# Patient Record
Sex: Female | Born: 1990
Health system: Southern US, Community
[De-identification: ages and names within clinical notes are randomized; demographics above are authoritative.]

## PROBLEM LIST (undated history)

## (undated) DIAGNOSIS — K259 Gastric ulcer, unspecified as acute or chronic, without hemorrhage or perforation: Secondary | ICD-10-CM

## (undated) DIAGNOSIS — G43909 Migraine, unspecified, not intractable, without status migrainosus: Secondary | ICD-10-CM

## (undated) DIAGNOSIS — F32A Depression, unspecified: Secondary | ICD-10-CM

## (undated) DIAGNOSIS — K219 Gastro-esophageal reflux disease without esophagitis: Secondary | ICD-10-CM

## (undated) DIAGNOSIS — E039 Hypothyroidism, unspecified: Secondary | ICD-10-CM

## (undated) DIAGNOSIS — K449 Diaphragmatic hernia without obstruction or gangrene: Secondary | ICD-10-CM

## (undated) DIAGNOSIS — F419 Anxiety disorder, unspecified: Secondary | ICD-10-CM

## (undated) DIAGNOSIS — U071 COVID-19: Secondary | ICD-10-CM

## (undated) DIAGNOSIS — F329 Major depressive disorder, single episode, unspecified: Secondary | ICD-10-CM

## (undated) HISTORY — DX: Major depressive disorder, single episode, unspecified: F32.9

## (undated) HISTORY — DX: Migraine, unspecified, not intractable, without status migrainosus: G43.909

## (undated) HISTORY — DX: Depression, unspecified: F32.A

## (undated) HISTORY — DX: Diaphragmatic hernia without obstruction or gangrene: K44.9

## (undated) HISTORY — DX: Hypothyroidism, unspecified: E03.9

## (undated) HISTORY — PX: OTHER SURGICAL HISTORY: SHX169

## (undated) HISTORY — DX: Gastric ulcer, unspecified as acute or chronic, without hemorrhage or perforation: K25.9

## (undated) HISTORY — DX: Anxiety disorder, unspecified: F41.9

---

## 2013-10-18 ENCOUNTER — Other Ambulatory Visit: Payer: Self-pay | Admitting: *Deleted

## 2013-10-18 DIAGNOSIS — R109 Unspecified abdominal pain: Secondary | ICD-10-CM

## 2013-10-19 ENCOUNTER — Other Ambulatory Visit: Payer: Self-pay | Admitting: *Deleted

## 2013-10-19 ENCOUNTER — Other Ambulatory Visit: Payer: Self-pay | Admitting: Pediatrics

## 2013-10-19 DIAGNOSIS — R109 Unspecified abdominal pain: Secondary | ICD-10-CM

## 2013-10-21 ENCOUNTER — Ambulatory Visit
Admission: RE | Admit: 2013-10-21 | Discharge: 2013-10-21 | Disposition: A | Discharge status: Home / Self Care | Payer: BC Managed Care – PPO | Source: Ambulatory Visit | Attending: *Deleted | Admitting: *Deleted

## 2013-10-21 DIAGNOSIS — R109 Unspecified abdominal pain: Secondary | ICD-10-CM

## 2015-05-02 HISTORY — PX: VAGINAL SEPTUM RESECTION: SHX2644

## 2018-07-14 ENCOUNTER — Ambulatory Visit: Payer: BC Managed Care – PPO | Admitting: Diagnostic Neuroimaging

## 2018-07-14 ENCOUNTER — Encounter: Payer: Self-pay | Admitting: *Deleted

## 2018-07-14 ENCOUNTER — Encounter: Payer: Self-pay | Admitting: Diagnostic Neuroimaging

## 2018-07-14 VITALS — BP 118/76 | HR 102 | Ht 63.0 in | Wt 178.0 lb

## 2018-07-14 DIAGNOSIS — R51 Headache: Secondary | ICD-10-CM

## 2018-07-14 DIAGNOSIS — R519 Headache, unspecified: Secondary | ICD-10-CM

## 2018-07-14 DIAGNOSIS — G43009 Migraine without aura, not intractable, without status migrainosus: Secondary | ICD-10-CM | POA: Diagnosis not present

## 2018-07-14 DIAGNOSIS — G8929 Other chronic pain: Secondary | ICD-10-CM

## 2018-07-14 MED ORDER — RIZATRIPTAN BENZOATE 10 MG PO TBDP: 10.0000 mg | ORAL_TABLET | ORAL | 11 refills | Status: DC | PRN

## 2018-07-14 MED ORDER — PROPRANOLOL HCL 20 MG PO TABS: 20.0000 mg | ORAL_TABLET | Freq: Two times a day (BID) | ORAL | 6 refills | Status: DC

## 2018-07-14 NOTE — Progress Notes (Signed)
[facility]  PATIENT: Carol Curry DOB: August 12, 1990  REFERRING CLINICIAN: Dr Maryjean [address]ah[address] Street HISTORY FROM: patient  REASON FOR VISIT: concerns of frequent headaches    HISTORICAL  CHIEF COMPLAINT:  Chief Complaint  Patient presents with  . New Patient (Initial Visit)    Referred by Dr. Maryjean Kahristopher Street  . Headaches/ Migraines    Rm 6, alone.  Migraines since 28y/o.      HISTORY OF PRESENT ILLNESS:    28 year old female with with concerns of migraine headaches. She has a history of gastric ulcers. hypothyroid, depression, and anxiety.   She reports a history of migraines since middle school (age [age] years old). She feels that this has worsened with age. She has 12-16 headaches a month with 1-2 being severe. Headache starts as a dull constant headache in bilateral temporal regions that radiates to the back of her head. If left untreated, she reports more of a constant pressure that will localize around either neck or jaw. Most often it will stay bilateral but sometimes will localize to one side or the other. When headaches are severe she will have light and sound sensitivity with nausea.   Her father and three siblings also suffer from migraines. She is taking Tylenol 1000 mg 3-4 times a week.   Usually the tylenol does help but she reports that she has to take a second dose of Tylenol.  She has used Excedrin in the past that did not help. She has never tried preventative therapy. She can not tolerate NSAIDs due to overuse of NSAIDs in college which led to stomach ulcer.   She denies aura symptoms. She has never had any brain imaging.   She stays tired all the time. She is being treated with levothyroxine 25mcg. She denies concerns of sleep apnea. She is also taking Cymbalta daily for anxiety/depression and feels that it is working well.     REVIEW OF SYSTEMS: Full 14 system review of systems performed and negative with exception of: seasonal  allergies, runny nose, headaches, sleepiness, depression, anxiety, too much sleep, decreased energy.   ALLERGIES: No Known Allergies  HOME MEDICATIONS: Outpatient Medications Prior to Visit  Medication Sig Dispense Refill  . acetaminophen (TYLENOL) 500 MG tablet Take 1,000 mg by mouth every 4 (four) hours as needed.    . DULoxetine (CYMBALTA) 60 MG capsule Take 60 mg by mouth daily.    . norethindrone-ethinyl estradiol (MICROGESTIN,JUNEL,LOESTRIN) 1-20 MG-MCG tablet Take 1 tablet by mouth daily.    Marland Kitchen. levothyroxine (SYNTHROID, LEVOTHROID) 25 MCG tablet Take 25 mcg by mouth daily before breakfast.    . busPIRone (BUSPAR) 5 MG tablet Take 5 mg by mouth 3 (three) times daily.    Marland Kitchen. escitalopram (LEXAPRO) 20 MG tablet Take 20 mg by mouth daily.     No facility-administered medications prior to visit.     PAST MEDICAL HISTORY: Past Medical History:  Diagnosis Date  . Anxiety   . Depression   . Hiatal hernia   . Hypothyroid   . Migraines   . Stomach ulcer     PAST SURGICAL HISTORY: Past Surgical History:  Procedure Laterality Date  . VAGINAL SEPTUM RESECTION  05/2015  . wisdom tooth extraction      FAMILY HISTORY: Family History  Problem Relation Age of Onset  . Hypertension Father   . Hyperlipidemia Father   . Diabetes Father   . Migraines Father   . Diabetes Paternal Grandmother   . Colon cancer Paternal Grandmother   .  Anxiety disorder Mother   . Migraines Sister   . Migraines Brother   . Migraines Sister     SOCIAL HISTORY: Social History   Socioeconomic History  . Marital status: Single    Spouse name: Not on file  . Number of children: Not on file  . Years of education: Not on file  . Highest education level: Not on file  Occupational History  . Not on file  Social Needs  . Financial resource strain: Not on file  . Food insecurity:    Worry: Not on file    Inability: Not on file  . Transportation needs:    Medical: Not on file    Non-medical: Not on  file  Tobacco Use  . Smoking status: Never Smoker  . Smokeless tobacco: Never Used  Substance and Sexual Activity  . Alcohol use: Yes    Comment: 2 drinks per month  . Drug use: Never  . Sexual activity: Not on file  Lifestyle  . Physical activity:    Days per week: Not on file    Minutes per session: Not on file  . Stress: Not on file  Relationships  . Social connections:    Talks on phone: Not on file    Gets together: Not on file    Attends religious service: Not on file    Active member of club or organization: Not on file    Attends meetings of clubs or organizations: Not on file    Relationship status: Not on file  . Intimate partner violence:    Fear of current or ex partner: Not on file    Emotionally abused: Not on file    Physically abused: Not on file    Forced sexual activity: Not on file  Other Topics Concern  . Not on file  Social History Narrative   Lives with spouse.  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  Education: Chief Operating Officer.  Children None.  Caffeine 1 cup coffee daily.      PHYSICAL EXAM  GENERAL EXAM/CONSTITUTIONAL: Vitals:  Vitals:   07/14/18 1535  BP: 118/76  Pulse: (!) 102  Weight: 178 lb (80.7 kg)  Height: 5\' 3"  (1.6 m)   Body mass index is 31.53 kg/m. Wt Readings from Last 3 Encounters:  07/14/18 178 lb (80.7 kg)    Patient is in no distress; well developed, nourished and groomed; neck is supple  CARDIOVASCULAR:  Examination of carotid arteries is normal; no carotid bruits  Regular rate and rhythm, no murmurs  Examination of peripheral vascular system by observation and palpation is normal  EYES:  Ophthalmoscopic exam of optic discs and posterior segments is normal; no papilledema or hemorrhages  Visual Acuity Screening   Right eye Left eye Both eyes  Without correction: 20/20 20/30   With correction:       MUSCULOSKELETAL:  Gait, strength, tone, movements noted in Neurologic exam  below  NEUROLOGIC: MENTAL STATUS:  No flowsheet data found.  awake, alert, oriented to person, place and time  recent and remote memory intact  normal attention and concentration  language fluent, comprehension intact, naming intact  fund of knowledge appropriate  CRANIAL NERVE:   2nd - no papilledema on fundoscopic exam  2nd, 3rd, 4th, 6th - pupils equal and reactive to light, visual fields full to confrontation, extraocular muscles intact, no nystagmus  5th - facial sensation symmetric  7th - facial strength symmetric  8th - hearing intact  9th - palate elevates  symmetrically, uvula midline  11th - shoulder shrug symmetric  12th - tongue protrusion midline  MOTOR:   normal bulk and tone, full strength in the BUE, BLE  SENSORY:   normal and symmetric to light touch, temperature, vibration  COORDINATION:   finger-nose-finger  REFLEXES:   deep tendon reflexes present and symmetric  GAIT/STATION:   narrow based gait     DIAGNOSTIC DATA (LABS, IMAGING, TESTING) - I reviewed patient records, labs, notes, testing and imaging myself where available.  No results found for: WBC, HGB, HCT, MCV, PLT No results found for: NA, K, CL, CO2, GLUCOSE, BUN, CREATININE, CALCIUM, PROT, ALBUMIN, AST, ALT, ALKPHOS, BILITOT, GFRNONAA, GFRAA No results found for: CHOL, HDL, LDLCALC, LDLDIRECT, TRIG, CHOLHDL No results found for: ATFT7D No results found for: VITAMINB12 No results found for: TSH       ASSESSMENT AND PLAN  28 y.o. year old female here with history of migraine without aura since 28 years old, with progressively worsening headaches over time.    Dx:  1. Chronic nonintractable headache, unspecified headache type   2. Migraine without aura and without status migrainosus, not intractable     PLAN:  - check MRI brain w/wo (rule out secondary causes of HA; worsening HA) - propranolol 20mg  twice a day --> migraine prevention - rizatriptan 10mg  as  needed for breakthrough headache; may repeat x 1 after 2 hours; max 2 tabs per day or 8 per month  Orders Placed This Encounter  Procedures  . MR BRAIN W WO CONTRAST   Meds ordered this encounter  Medications  . rizatriptan (MAXALT-MLT) 10 MG disintegrating tablet    Sig: Take 1 tablet (10 mg total) by mouth as needed for migraine. May repeat in 2 hours if needed    Dispense:  9 tablet    Refill:  11  . propranolol (INDERAL) 20 MG tablet    Sig: Take 1 tablet (20 mg total) by mouth 2 (two) times daily.    Dispense:  60 tablet    Refill:  6   Return in about 3 months (around 10/13/2018) for with NP/PA (Amy).    Suanne Marker, MD 07/14/2018, 4:11 PM Certified in Neurology, Neurophysiology and Neuroimaging  [facility] [address] [place], [place] [address] [phone]

## 2018-07-14 NOTE — Patient Instructions (Addendum)
-   check MRI brain   - start propranolol 20mg  twice a day (migraine prevention)  - rizatriptan 10mg  as needed for breakthrough headache; may repeat x 1 after 2 hours; max 2 tabs per day or 8 per month   To prevent or relieve headaches, try the following:  Cool Compress. Lie down and place a cool compress on your head.   Avoid headache triggers. If certain foods or odors seem to have triggered your migraines in the past, avoid them. A headache diary might help you identify triggers.   Include physical activity in your daily routine.   Manage stress. Find healthy ways to cope with the stressors, such as delegating tasks on your to-do list.   Practice relaxation techniques. Try deep breathing, yoga, massage and visualization.   Eat regularly. Eating regularly scheduled meals and maintaining a healthy diet might help prevent headaches. Also, drink plenty of fluids.   Follow a regular sleep schedule. Sleep deprivation might contribute to headaches  Consider biofeedback. With this mind-body technique, you learn to control certain bodily functions - such as muscle tension, heart rate and blood pressure - to prevent headaches or reduce headache pain.

## 2018-07-15 ENCOUNTER — Telehealth: Payer: Self-pay | Admitting: Diagnostic Neuroimaging

## 2018-07-15 NOTE — Telephone Encounter (Signed)
lvm for pt to call back about scheduling mri  [employer] Auth: [id number] (exp. 07/15/18 to 08/13/18)

## 2018-10-07 ENCOUNTER — Telehealth: Payer: Self-pay | Admitting: Family Medicine

## 2018-10-07 NOTE — Telephone Encounter (Signed)
I called and spoke with the patient regarding changing their apt to a VV. I explained to the patient that we would file their insurance and they gave consent. I also walked through the steps of downloading the app, and starting the virtual meeting, after confirming the patient had access to a smart phone with a working camera and microphone access.   Email: [email or web address]

## 2018-10-07 NOTE — Telephone Encounter (Signed)
LMVM for pt to return call (update chart) medications, allergies, history and pharmacy.

## 2018-10-07 NOTE — Telephone Encounter (Signed)
Updated meds, allergies, hx, pharmacy. Needs refill on maxalt.

## 2018-10-07 NOTE — Addendum Note (Signed)
Addended by: Guy Begin on: 10/07/2018 04:56 PM   Modules accepted: Orders

## 2018-10-08 ENCOUNTER — Ambulatory Visit (INDEPENDENT_AMBULATORY_CARE_PROVIDER_SITE_OTHER): Payer: BC Managed Care – PPO | Admitting: Family Medicine

## 2018-10-08 ENCOUNTER — Encounter: Payer: Self-pay | Admitting: Family Medicine

## 2018-10-08 ENCOUNTER — Other Ambulatory Visit: Payer: Self-pay

## 2018-10-08 DIAGNOSIS — G43009 Migraine without aura, not intractable, without status migrainosus: Secondary | ICD-10-CM | POA: Diagnosis not present

## 2018-10-08 MED ORDER — PROPRANOLOL HCL 20 MG PO TABS: 20.0000 mg | ORAL_TABLET | Freq: Two times a day (BID) | ORAL | 6 refills | Status: DC

## 2018-10-08 MED ORDER — TOPIRAMATE 25 MG PO TABS: 25.0000 mg | ORAL_TABLET | Freq: Every day | ORAL | 3 refills | Status: DC

## 2018-10-08 MED ORDER — RIZATRIPTAN BENZOATE 10 MG PO TBDP: 10.0000 mg | ORAL_TABLET | ORAL | 11 refills | Status: DC | PRN

## 2018-10-08 NOTE — Progress Notes (Signed)
I reviewed note and agree with plan.   Suanne Marker, MD 10/08/2018, 11:11 AM Certified in Neurology, Neurophysiology and Neuroimaging  [facility] [address] [place], [place] [address] [phone]

## 2018-10-08 NOTE — Progress Notes (Signed)
PATIENT: Carol Curry DOB: March 12, 1991  REASON FOR VISIT: follow up HISTORY FROM: patient  Virtual Visit via Telephone Note  I connected with Carol Curry on 10/08/18 at  8:30 AM EDT by telephone and verified that I am speaking with the correct person using two identifiers.   I discussed the limitations, risks, security and privacy concerns of performing an evaluation and management service by telephone and the availability of in person appointments. I also discussed with the patient that there may be a patient responsible charge related to this service. The patient expressed understanding and agreed to proceed.   History of Present Illness:  10/08/18 Carol Curry is a 28 y.o. female for follow up. She was started on propranolol 20mg  BID and rizatriptan as needed at her last visit.  She has noticed a decrease in the severity of her headaches.  Unfortunately she continues to have daily headaches.  She is trying to limit doses of Tylenol but continues at least 3 doses per week.  She has not had MRI.  She does not feel that this is necessary at this time.  She has tolerated medications well with no obvious adverse effects.  No history of kidney stones.    HPI (copied from Dr Richrd Humbles note on 07/14/2018):  28 year old female with with concerns of migraine headaches. She has a history of gastric ulcers. hypothyroid, depression, and anxiety.   She reports a history of migraines since middle school (age [age] years old). She feels that this has worsened with age. She has 12-16 headaches a month with 1-2 being severe. Headache starts as a dull constant headache in bilateral temporal regions that radiates to the back of her head. If left untreated, she reports more of a constant pressure that will localize around either neck or jaw. Most often it will stay bilateral but sometimes will localize to one side or the other. When headaches are severe she will have light and sound sensitivity  with nausea.   Her father and three siblings also suffer from migraines. She is taking Tylenol 1000 mg 3-4 times a week.   Usually the tylenol does help but she reports that she has to take a second dose of Tylenol.  She has used Excedrin in the past that did not help. She has never tried preventative therapy. She can not tolerate NSAIDs due to overuse of NSAIDs in college which led to stomach ulcer.   She denies aura symptoms. She has never had any brain imaging.   She stays tired all the time. She is being treated with levothyroxine . She denies concerns of sleep apnea. She is also taking Cymbalta daily for anxiety/depression and feels that it is working well.    Observations/Objective:  Generalized: Well developed, in no acute distress  Mentation: Alert oriented to time, place, history taking. Follows all commands speech and language fluent Motor: moves all extremities fluently.    Assessment and Plan:  28 y.o. year old female  has a past medical history of Anxiety, Depression, Hiatal hernia, Hypothyroid, Migraines, and Stomach ulcer. here with    ICD-10-CM   1. Migraine without aura and without status migrainosus, not intractable G43.009    Ms. Carol Curry is doing well on propranolol and rizatriptan, however, she continues to have frequent headaches with migrainous features.  We have discussed several options including increasing propranolol or adding another agent.  She wishes to add topiramate 25 mg at night.  We will continue propranolol 20 mg twice daily.  We will also refill her rizatriptan.  She was reeducated on appropriate ministration of these medications.  I have also advised against regular use of Tylenol.  She was instructed to call the office in 2 to 3 weeks with a progress report.  May increase topiramate at that time if well tolerated.  I have also educated Mrs. Carol Curry on the potential for topiramate to lower the effectiveness of birth control, although we are  starting at a low dose.  She was advised against pregnancy.  She verbalizes understanding and agreement with this plan.  No orders of the defined types were placed in this encounter.   No orders of the defined types were placed in this encounter.    Follow Up Instructions:  I discussed the assessment and treatment plan with the patient. The patient was provided an opportunity to ask questions and all were answered. The patient agreed with the plan and demonstrated an understanding of the instructions.   The patient was advised to call back or seek an in-person evaluation if the symptoms worsen or if the condition fails to improve as anticipated.  I provided 25 minutes of non-face-to-face time during this encounter.  Patient is located at her place of residence during video conference.  Provider is located at her place of residence.  Alverda SkeansSandy Young, RN help to facilitate visit.   Shawnie DapperAmy Rorey Bisson, NP

## 2018-10-13 ENCOUNTER — Ambulatory Visit: Payer: BC Managed Care – PPO | Admitting: Family Medicine

## 2018-11-11 ENCOUNTER — Telehealth: Payer: Self-pay | Admitting: Family Medicine

## 2018-11-11 NOTE — Telephone Encounter (Signed)
Called and lm on vm for pt to call [facility] to schedule Return in about 6 months (around 04/09/2019). Per Shawnie Dapper, NP

## 2019-10-14 ENCOUNTER — Other Ambulatory Visit: Payer: Self-pay | Admitting: Family Medicine

## 2019-10-14 DIAGNOSIS — G43009 Migraine without aura, not intractable, without status migrainosus: Secondary | ICD-10-CM

## 2019-10-14 MED ORDER — TOPIRAMATE 25 MG PO TABS: 25.0000 mg | ORAL_TABLET | Freq: Every day | ORAL | 3 refills | Status: DC

## 2019-10-14 MED ORDER — PROPRANOLOL HCL 20 MG PO TABS: 20.0000 mg | ORAL_TABLET | Freq: Two times a day (BID) | ORAL | 6 refills | Status: DC

## 2019-10-14 NOTE — Telephone Encounter (Signed)
1) Medication(s) Requested (by name): topiramate (TOPAMAX) 25 MG tablet  propranolol (INDERAL) 20 MG tablet   2) Pharmacy of Choice: [facility] - [place], [place] - [address]  [address] [place], [place] [address]

## 2019-12-13 ENCOUNTER — Telehealth: Payer: Self-pay | Admitting: Adult Health

## 2019-12-13 NOTE — Telephone Encounter (Signed)
Patient LVM. Calling to confirm appointment. No appointment scheduled. Nor has she been seen at our office. Please call.

## 2019-12-22 ENCOUNTER — Other Ambulatory Visit: Payer: Self-pay

## 2019-12-22 ENCOUNTER — Encounter: Payer: Self-pay | Admitting: Family Medicine

## 2019-12-22 ENCOUNTER — Ambulatory Visit: Payer: BC Managed Care – PPO | Admitting: Family Medicine

## 2019-12-22 DIAGNOSIS — G43009 Migraine without aura, not intractable, without status migrainosus: Secondary | ICD-10-CM

## 2019-12-22 MED ORDER — PROPRANOLOL HCL 20 MG PO TABS: 20.0000 mg | ORAL_TABLET | Freq: Two times a day (BID) | ORAL | 3 refills | Status: DC

## 2019-12-22 MED ORDER — TOPIRAMATE 25 MG PO TABS: 25.0000 mg | ORAL_TABLET | Freq: Every day | ORAL | 3 refills | Status: DC

## 2019-12-22 MED ORDER — RIZATRIPTAN BENZOATE 10 MG PO TBDP: 10.0000 mg | ORAL_TABLET | ORAL | 11 refills | Status: DC | PRN

## 2019-12-22 NOTE — Progress Notes (Signed)
PATIENT: Carol Curry DOB: 01/07/1991  REASON FOR VISIT: follow up HISTORY FROM: patient  Chief Complaint  Patient presents with  . Follow-up    Rm 2 here for a mirgaine f/u. Pt said her migraines are better.     HISTORY OF PRESENT ILLNESS: Today 12/22/19 Carol Curry is a 29 y.o. female here today for follow up for migraines. She is doing well on propranolol 20mg  BID and topiramate 25mg  at bedtime. She reports that headaches are well managed. On average, she has about 2-3 headache days per month. She uses rizatriptan rarely for migraine symptoms.   HISTORY: (copied from my note on 10/08/2018)  Carol Curry is a 29 y.o. female for follow up. She was started on propranolol 20mg  BID and rizatriptan as needed at her last visit.  She has noticed a decrease in the severity of her headaches.  Unfortunately she continues to have daily headaches.  She is trying to limit doses of Tylenol but continues at least 3 doses per week.  She has not had MRI.  She does not feel that this is necessary at this time.  She has tolerated medications well with no obvious adverse effects.  No history of kidney stones.   HPI (copied from Dr Gladstone Lighter note on 07/14/2018):  29 year old female withwith concerns of migraine headaches. She has a history of gastric ulcers. hypothyroid, depression, and anxiety.   She reports a history of migraines since middle school(age 44-42 years old). She feels that this has worsened with age. She has 12-16 headaches a month with 1-2 being severe. Headache starts as a dull constant headache in bilateral temporal regions that radiates to the back of her head. If left untreated, she reports more of a constant pressure that will localize around either neck or jaw. Most often it will stay bilateral but sometimes will localize to one side or the other. When headaches are severe she will have light and sound sensitivity with nausea.   Her father and three siblings also suffer  from migraines. She is taking Tylenol 1000 mg 3-4 times a week.   Usually the tylenol does help but she reports that she has to take a second dose of Tylenol. She has used Excedrin in the past that did not help. She has never tried preventative therapy. She can not tolerate NSAIDs due to overuse of NSAIDs in college which led to stomach ulcer.   She denies aura symptoms. She has never had any brain imaging.   She stays tired all the time. She is being treated with levothyroxine 39mcg. She denies concerns of sleep apnea. She is also taking Cymbalta daily for anxiety/depression and feels that it is working well.   REVIEW OF SYSTEMS: Out of a complete 14 system review of symptoms, the patient complains only of the following symptoms, headaches and all other reviewed systems are negative.  ALLERGIES: No Known Allergies  HOME MEDICATIONS: Outpatient Medications Prior to Visit  Medication Sig Dispense Refill  . acetaminophen (TYLENOL) 500 MG tablet Take 1,000 mg by mouth every 4 (four) hours as needed.    . norethindrone-ethinyl estradiol (JUNEL FE 1/20) 1-20 MG-MCG tablet Take 1 tablet by mouth daily.    Marland Kitchen PARoxetine (PAXIL) 20 MG tablet Take 20 mg by mouth daily.    . propranolol (INDERAL) 20 MG tablet Take 1 tablet (20 mg total) by mouth 2 (two) times daily. 60 tablet 6  . rizatriptan (MAXALT-MLT) 10 MG disintegrating tablet Take 1 tablet (10 mg  total) by mouth as needed for migraine. May repeat in 2 hours if needed 9 tablet 11  . topiramate (TOPAMAX) 25 MG tablet Take 1 tablet (25 mg total) by mouth at bedtime. 90 tablet 3  . DULoxetine (CYMBALTA) 60 MG capsule Take 60 mg by mouth daily.    . norethindrone-ethinyl estradiol (MICROGESTIN,JUNEL,LOESTRIN) 1-20 MG-MCG tablet Take 1 tablet by mouth daily.     No facility-administered medications prior to visit.    PAST MEDICAL HISTORY: Past Medical History:  Diagnosis Date  . Anxiety   . Depression   . Hiatal hernia   . Hypothyroid    . Migraines   . Stomach ulcer     PAST SURGICAL HISTORY: Past Surgical History:  Procedure Laterality Date  . VAGINAL SEPTUM RESECTION  05/2015  . wisdom tooth extraction      FAMILY HISTORY: Family History  Problem Relation Age of Onset  . Hypertension Father   . Hyperlipidemia Father   . Diabetes Father   . Migraines Father   . Diabetes Paternal Grandmother   . Colon cancer Paternal Grandmother   . Anxiety disorder Mother   . Migraines Sister   . Migraines Brother   . Migraines Sister     SOCIAL HISTORY: Social History   Socioeconomic History  . Marital status: Single    Spouse name: Not on file  . Number of children: Not on file  . Years of education: Not on file  . Highest education level: Not on file  Occupational History  . Not on file  Tobacco Use  . Smoking status: Never Smoker  . Smokeless tobacco: Never Used  Substance and Sexual Activity  . Alcohol use: Yes    Comment: 2 drinks per month  . Drug use: Never  . Sexual activity: Not on file  Other Topics Concern  . Not on file  Social History Narrative   Lives with spouse.  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  Education: Chief Operating Officer.  Children None.  Caffeine 1 cup coffee daily.    Social Determinants of Health   Financial Resource Strain:   . Difficulty of Paying Living Expenses:   Food Insecurity:   . Worried About Programme researcher, broadcasting/film/video in the Last Year:   . Barista in the Last Year:   Transportation Needs:   . Freight forwarder (Medical):   Marland Kitchen Lack of Transportation (Non-Medical):   Physical Activity:   . Days of Exercise per Week:   . Minutes of Exercise per Session:   Stress:   . Feeling of Stress :   Social Connections:   . Frequency of Communication with Friends and Family:   . Frequency of Social Gatherings with Friends and Family:   . Attends Religious Services:   . Active Member of Clubs or Organizations:   . Attends Banker  Meetings:   Marland Kitchen Marital Status:   Intimate Partner Violence:   . Fear of Current or Ex-Partner:   . Emotionally Abused:   Marland Kitchen Physically Abused:   . Sexually Abused:       PHYSICAL EXAM  Vitals:   12/22/19 0817  BP: 105/65  Pulse: 80  Weight: 172 lb (78 kg)  Height: 5\' 3"  (1.6 m)   Body mass index is 30.47 kg/m.  Generalized: Well developed, in no acute distress  Cardiology: normal rate and rhythm, no murmur noted Respiratory: clear to auscultation bilaterally  Neurological examination  Mentation: Alert oriented to time, place,  history taking. Follows all commands speech and language fluent Cranial nerve II-XII: Pupils were equal round reactive to light. Extraocular movements were full, visual field were full on confrontational test. Facial sensation and strength were normal. Uvula tongue midline. Head turning and shoulder shrug  were normal and symmetric. Motor: The motor testing reveals 5 over 5 strength of all 4 extremities. Good symmetric motor tone is noted throughout.  Sensory: Sensory testing is intact to soft touch on all 4 extremities. No evidence of extinction is noted.  Coordination: Cerebellar testing reveals good finger-nose-finger and heel-to-shin bilaterally.  Gait and station: Gait is normal.   DIAGNOSTIC DATA (LABS, IMAGING, TESTING) - I reviewed patient records, labs, notes, testing and imaging myself where available.  No flowsheet data found.   No results found for: WBC, HGB, HCT, MCV, PLT No results found for: NA, K, CL, CO2, GLUCOSE, BUN, CREATININE, CALCIUM, PROT, ALBUMIN, AST, ALT, ALKPHOS, BILITOT, GFRNONAA, GFRAA No results found for: CHOL, HDL, LDLCALC, LDLDIRECT, TRIG, CHOLHDL No results found for: WUXL2G No results found for: VITAMINB12 No results found for: TSH     ASSESSMENT AND PLAN 29 y.o. year old female  has a past medical history of Anxiety, Depression, Hiatal hernia, Hypothyroid, Migraines, and Stomach ulcer. here with     ICD-10-CM    1. Migraine without aura and without status migrainosus, not intractable  G43.009 topiramate (TOPAMAX) 25 MG tablet    propranolol (INDERAL) 20 MG tablet    rizatriptan (MAXALT-MLT) 10 MG disintegrating tablet    Moxie is doing well.  Migraines are well managed on propranolol 20 mg twice daily and topiramate 25 mg at bedtime.  We will continue current therapy.  She may continue rizatriptan as needed for abortive therapy.  We have discussed common triggers.  Healthy lifestyle habits encouraged.  I have advised that she may continue to follow-up with primary care if willing, otherwise, she will follow-up with [place] annually.  She verbalizes understanding and agreement with this plan.   No orders of the defined types were placed in this encounter.    Meds ordered this encounter  Medications  . topiramate (TOPAMAX) 25 MG tablet    Sig: Take 1 tablet (25 mg total) by mouth at bedtime.    Dispense:  90 tablet    Refill:  3    Order Specific Question:   Supervising Provider    Answer:   Anson Fret [id number]  . propranolol (INDERAL) 20 MG tablet    Sig: Take 1 tablet (20 mg total) by mouth 2 (two) times daily.    Dispense:  180 tablet    Refill:  3    Order Specific Question:   Supervising Provider    Answer:   Anson Fret [id number]  . rizatriptan (MAXALT-MLT) 10 MG disintegrating tablet    Sig: Take 1 tablet (10 mg total) by mouth as needed for migraine. May repeat in 2 hours if needed    Dispense:  9 tablet    Refill:  11    Order Specific Question:   Supervising Provider    Answer:   Anson Fret [id number]      I spent 15 minutes with the patient. 50% of this time was spent counseling and educating patient on plan of care and medications.    Shawnie Dapper, FNP-C 12/22/2019, 10:39 AM [facility] [address], [address] [place], [place] [address] [phone]

## 2019-12-22 NOTE — Patient Instructions (Signed)
We will continue propanolol 20mg  twice daily and topiramate 25mg  at bedtime. Continue rizatriptan as needed for abortive therapy.   Healthy lifestyle habits with well balanced diet and regular exercise. Stay well hydrated.   May follow up with PCP, return to see me in 1 year if refills not provided by PCP.   Migraine Headache A migraine headache is a very strong throbbing pain on one side or both sides of your head. This type of headache can also cause other symptoms. It can last from 4 hours to 3 days. Talk with your doctor about what things may bring on (trigger) this condition. What are the causes? The exact cause of this condition is not known. This condition may be triggered or caused by:  Drinking alcohol.  Smoking.  Taking medicines, such as: ? Medicine used to treat chest pain (nitroglycerin). ? Birth control pills. ? Estrogen. ? Some blood pressure medicines.  Eating or drinking certain products.  Doing physical activity. Other things that may trigger a migraine headache include:  Having a menstrual period.  Pregnancy.  Hunger.  Stress.  Not getting enough sleep or getting too much sleep.  Weather changes.  Tiredness (fatigue). What increases the risk?  Being 46-50 years old.  Being female.  Having a family history of migraine headaches.  Being Caucasian.  Having depression or anxiety.  Being very overweight. What are the signs or symptoms?  A throbbing pain. This pain may: ? Happen in any area of the head, such as on one side or both sides. ? Make it hard to do daily activities. ? Get worse with physical activity. ? Get worse around bright lights or loud noises.  Other symptoms may include: ? Feeling sick to your stomach (nauseous). ? Vomiting. ? Dizziness. ? Being sensitive to bright lights, loud noises, or smells.  Before you get a migraine headache, you may get warning signs (an aura). An aura may include: ? Seeing flashing lights or  having blind spots. ? Seeing bright spots, halos, or zigzag lines. ? Having tunnel vision or blurred vision. ? Having numbness or a tingling feeling. ? Having trouble talking. ? Having weak muscles.  Some people have symptoms after a migraine headache (postdromal phase), such as: ? Tiredness. ? Trouble thinking (concentrating). How is this treated?  Taking medicines that: ? Relieve pain. ? Relieve the feeling of being sick to your stomach. ? Prevent migraine headaches.  Treatment may also include: ? Having acupuncture. ? Avoiding foods that bring on migraine headaches. ? Learning ways to control your body functions (biofeedback). ? Therapy to help you know and deal with negative thoughts (cognitive behavioral therapy). Follow these instructions at home: Medicines  Take over-the-counter and prescription medicines only as told by your doctor.  Ask your doctor if the medicine prescribed to you: ? Requires you to avoid driving or using heavy machinery. ? Can cause trouble pooping (constipation). You may need to take these steps to prevent or treat trouble pooping:  Drink enough fluid to keep your pee (urine) pale yellow.  Take over-the-counter or prescription medicines.  Eat foods that are high in fiber. These include beans, whole grains, and fresh fruits and vegetables.  Limit foods that are high in fat and sugar. These include fried or sweet foods. Lifestyle  Do not drink alcohol.  Do not use any products that contain nicotine or tobacco, such as cigarettes, e-cigarettes, and chewing tobacco. If you need help quitting, ask your doctor.  Get at least 8 hours of  sleep every night.  Limit and deal with stress. General instructions      Keep a journal to find out what may bring on your migraine headaches. For example, write down: ? What you eat and drink. ? How much sleep you get. ? Any change in what you eat or drink. ? Any change in your medicines.  If you have a  migraine headache: ? Avoid things that make your symptoms worse, such as bright lights. ? It may help to lie down in a dark, quiet room. ? Do not drive or use heavy machinery. ? Ask your doctor what activities are safe for you.  Keep all follow-up visits as told by your doctor. This is important. Contact a doctor if:  You get a migraine headache that is different or worse than others you have had.  You have more than 15 headache days in one month. Get help right away if:  Your migraine headache gets very bad.  Your migraine headache lasts longer than 72 hours.  You have a fever.  You have a stiff neck.  You have trouble seeing.  Your muscles feel weak or like you cannot control them.  You start to lose your balance a lot.  You start to have trouble walking.  You pass out (faint).  You have a seizure. Summary  A migraine headache is a very strong throbbing pain on one side or both sides of your head. These headaches can also cause other symptoms.  This condition may be treated with medicines and changes to your lifestyle.  Keep a journal to find out what may bring on your migraine headaches.  Contact a doctor if you get a migraine headache that is different or worse than others you have had.  Contact your doctor if you have more than 15 headache days in a month. This information is not intended to replace advice given to you by your health care provider. Make sure you discuss any questions you have with your health care provider. Document Revised: 10/09/2018 Document Reviewed: 07/30/2018 Elsevier Patient Education  2020 [employer].

## 2019-12-22 NOTE — Progress Notes (Signed)
I reviewed note and agree with plan.   Suanne Marker, MD 12/22/2019, 4:07 PM Certified in Neurology, Neurophysiology and Neuroimaging  [facility] [address] [place], [place] [address] [phone]

## 2020-04-16 ENCOUNTER — Emergency Department (HOSPITAL_COMMUNITY)
Admission: EM | Admit: 2020-04-16 | Discharge: 2020-04-17 | Disposition: A | Discharge status: Home / Self Care | Payer: BC Managed Care – PPO | Attending: Emergency Medicine | Admitting: Emergency Medicine

## 2020-04-16 ENCOUNTER — Encounter (HOSPITAL_COMMUNITY): Payer: Self-pay | Admitting: Emergency Medicine

## 2020-04-16 ENCOUNTER — Emergency Department (HOSPITAL_COMMUNITY): Payer: BC Managed Care – PPO

## 2020-04-16 ENCOUNTER — Other Ambulatory Visit: Payer: Self-pay

## 2020-04-16 DIAGNOSIS — R110 Nausea: Secondary | ICD-10-CM | POA: Insufficient documentation

## 2020-04-16 DIAGNOSIS — R0600 Dyspnea, unspecified: Secondary | ICD-10-CM | POA: Diagnosis not present

## 2020-04-16 DIAGNOSIS — Z5321 Procedure and treatment not carried out due to patient leaving prior to being seen by health care provider: Secondary | ICD-10-CM | POA: Insufficient documentation

## 2020-04-16 DIAGNOSIS — R072 Precordial pain: Secondary | ICD-10-CM | POA: Insufficient documentation

## 2020-04-16 HISTORY — DX: COVID-19: U07.1

## 2020-04-16 HISTORY — DX: Gastro-esophageal reflux disease without esophagitis: K21.9

## 2020-04-16 LAB — BASIC METABOLIC PANEL
Anion gap: 10 (ref 5–15)
BUN: 12 mg/dL (ref 6–20)
CO2: 21 mmol/L — ABNORMAL LOW (ref 22–32)
Calcium: 9.5 mg/dL (ref 8.9–10.3)
Chloride: 106 mmol/L (ref 98–111)
Creatinine, Ser: 0.74 mg/dL (ref 0.44–1.00)
GFR, Estimated: >60 mL/min (ref >60)
Glucose, Bld: 88 mg/dL (ref 70–99)
Potassium: 3.8 mmol/L (ref 3.5–5.1)
Sodium: 137 mmol/L (ref 135–145)

## 2020-04-16 LAB — CBC
HCT: 43.2 % (ref 36.0–46.0)
Hemoglobin: 14.1 g/dL (ref 12.0–15.0)
MCH: 28.8 pg (ref 26.0–34.0)
MCHC: 32.6 g/dL (ref 30.0–36.0)
MCV: 88.3 fL (ref 80.0–100.0)
Platelets: 385 10*3/uL (ref 150–400)
RBC: 4.89 MIL/uL (ref 3.87–5.11)
RDW: 12.8 % (ref 11.5–15.5)
WBC: 9.2 10*3/uL (ref 4.0–10.5)
nRBC: 0 % (ref 0.0–0.2)

## 2020-04-16 LAB — I-STAT BETA HCG BLOOD, ED (MC, WL, AP ONLY): I-stat hCG, quantitative: <5 m[IU]/mL (ref <5)

## 2020-04-16 LAB — TROPONIN I (HIGH SENSITIVITY): Troponin I (High Sensitivity): <2 ng/L (ref <18)

## 2020-04-16 NOTE — ED Triage Notes (Addendum)
Pt states she was sent from [facility] in [place].  Reports "L sided sternal pain" x 2 days with nausea.  Denies SOB but states she feels like she has to take a deep breath.  History of GERD. Took Pepto without relief.

## 2020-04-16 NOTE — ED Notes (Signed)
Patient was sitting on chair and suddenly went to the floor. Pt does not remember "falling" to the floor. Patient was on all four, did not hit her head or LOC.

## 2020-04-17 NOTE — ED Notes (Signed)
Lwbs. 

## 2020-12-29 ENCOUNTER — Telehealth: Payer: Self-pay | Admitting: Family Medicine

## 2020-12-29 ENCOUNTER — Other Ambulatory Visit: Payer: Self-pay | Admitting: Neurology

## 2020-12-29 DIAGNOSIS — G43009 Migraine without aura, not intractable, without status migrainosus: Secondary | ICD-10-CM

## 2020-12-29 MED ORDER — PROPRANOLOL HCL 20 MG PO TABS: 20.0000 mg | ORAL_TABLET | Freq: Two times a day (BID) | ORAL | 0 refills | Status: AC

## 2020-12-29 MED ORDER — TOPIRAMATE 25 MG PO TABS
25.0000 mg | ORAL_TABLET | Freq: Every day | ORAL | 0 refills | Status: AC
Start: 2020-12-29 — End: ?

## 2020-12-29 MED ORDER — RIZATRIPTAN BENZOATE 10 MG PO TBDP: 10.0000 mg | ORAL_TABLET | ORAL | 0 refills | Status: AC | PRN

## 2020-12-29 NOTE — Telephone Encounter (Signed)
Pt called and scheduled appt and requested that her propranolol (INDERAL) 20 MG tablet  rizatriptan (MAXALT-MLT) 10 MG disintegrating tablet  topiramate (TOPAMAX) 25 MG tablet Be [facility]

## 2020-12-29 NOTE — Telephone Encounter (Signed)
Patient called for refills.  She had not been seen in the office for over a year.  Refills for 90 days were provided and she needs to have a follow-up appointment

## 2021-01-02 NOTE — Telephone Encounter (Signed)
This pt has appt 04-16-21 with AL/NP

## 2021-04-16 ENCOUNTER — Ambulatory Visit: Payer: Self-pay | Admitting: Family Medicine

## 2022-04-30 IMAGING — DX DG CHEST 2V
2 series · 2 of 2 positions shown · non-contrast
Comparison: [facility] Arabori CT Abdomen and Pelvis 10/27/2018

CLINICAL DATA: 28-year-old female with chest pain radiating to the
left side, scapula.

EXAM:
CHEST - 2 VIEW

[chest pa]
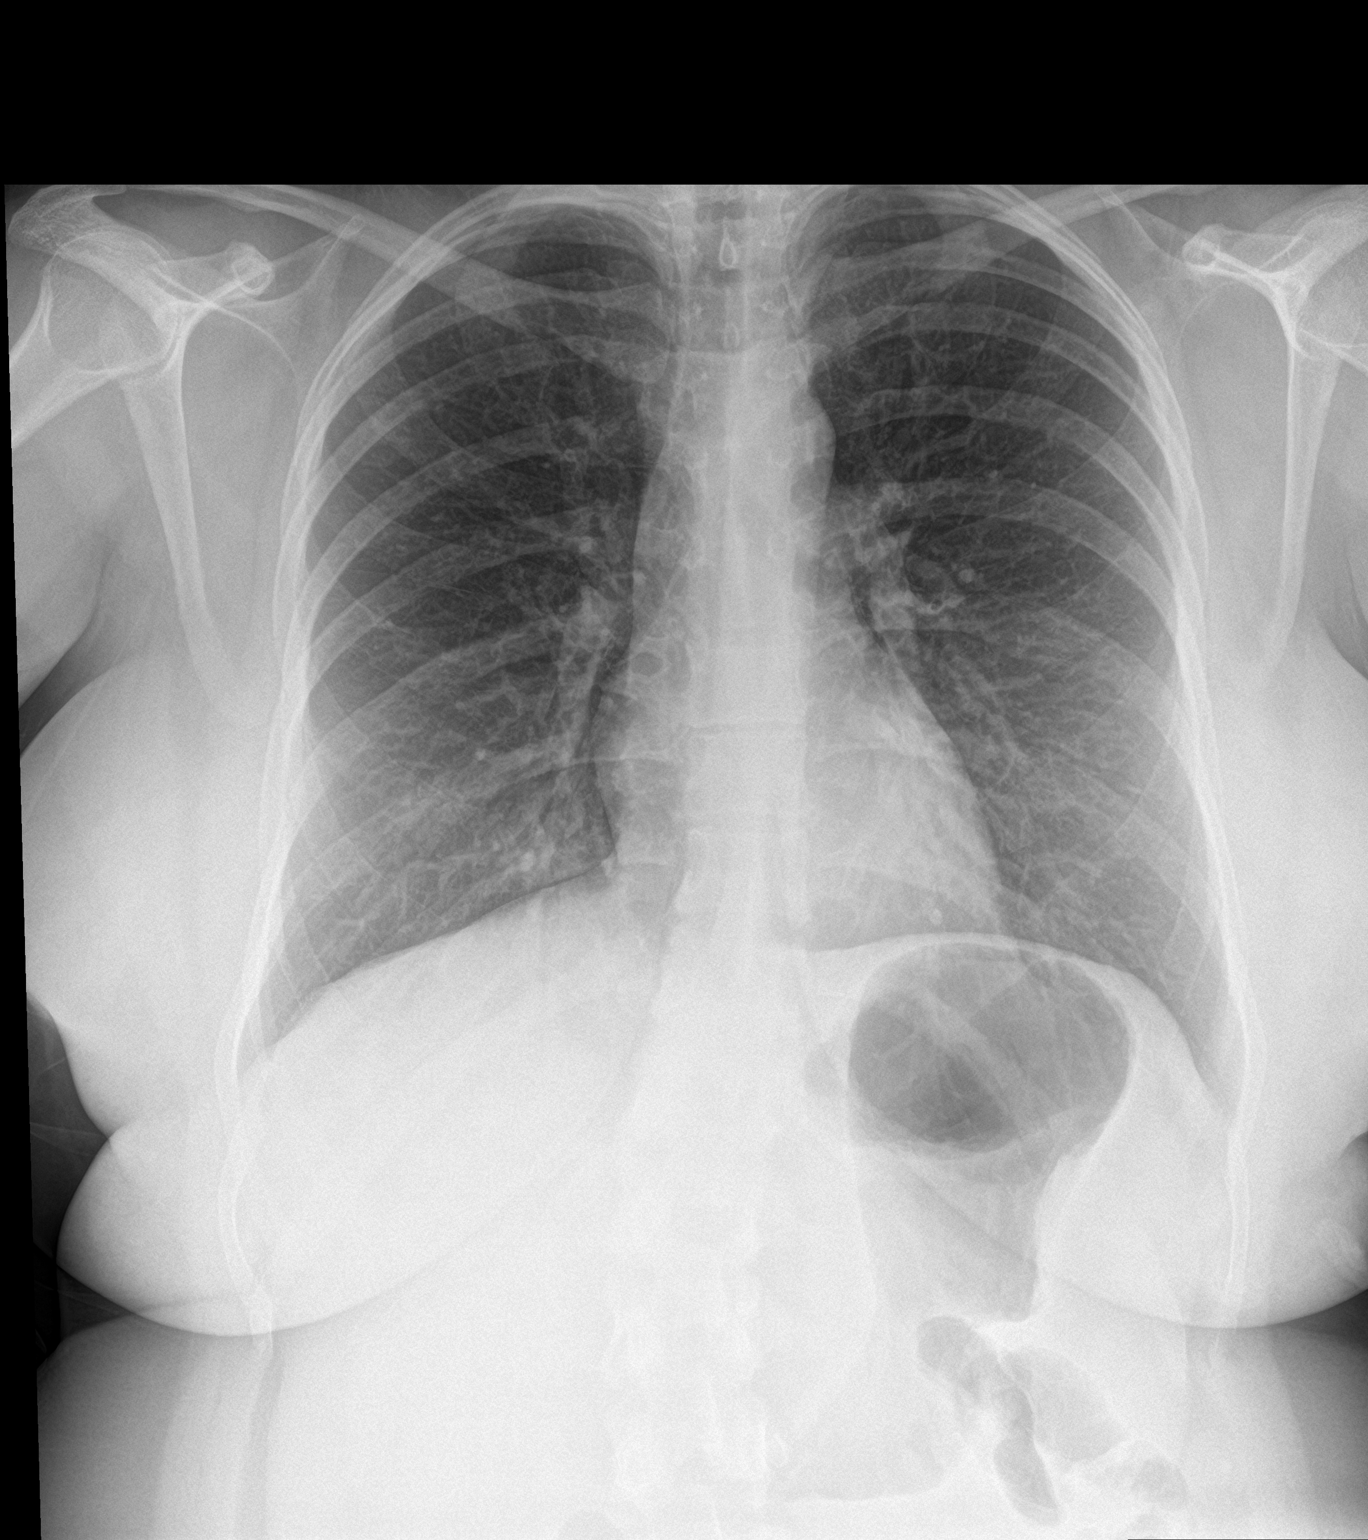

[chest lat]
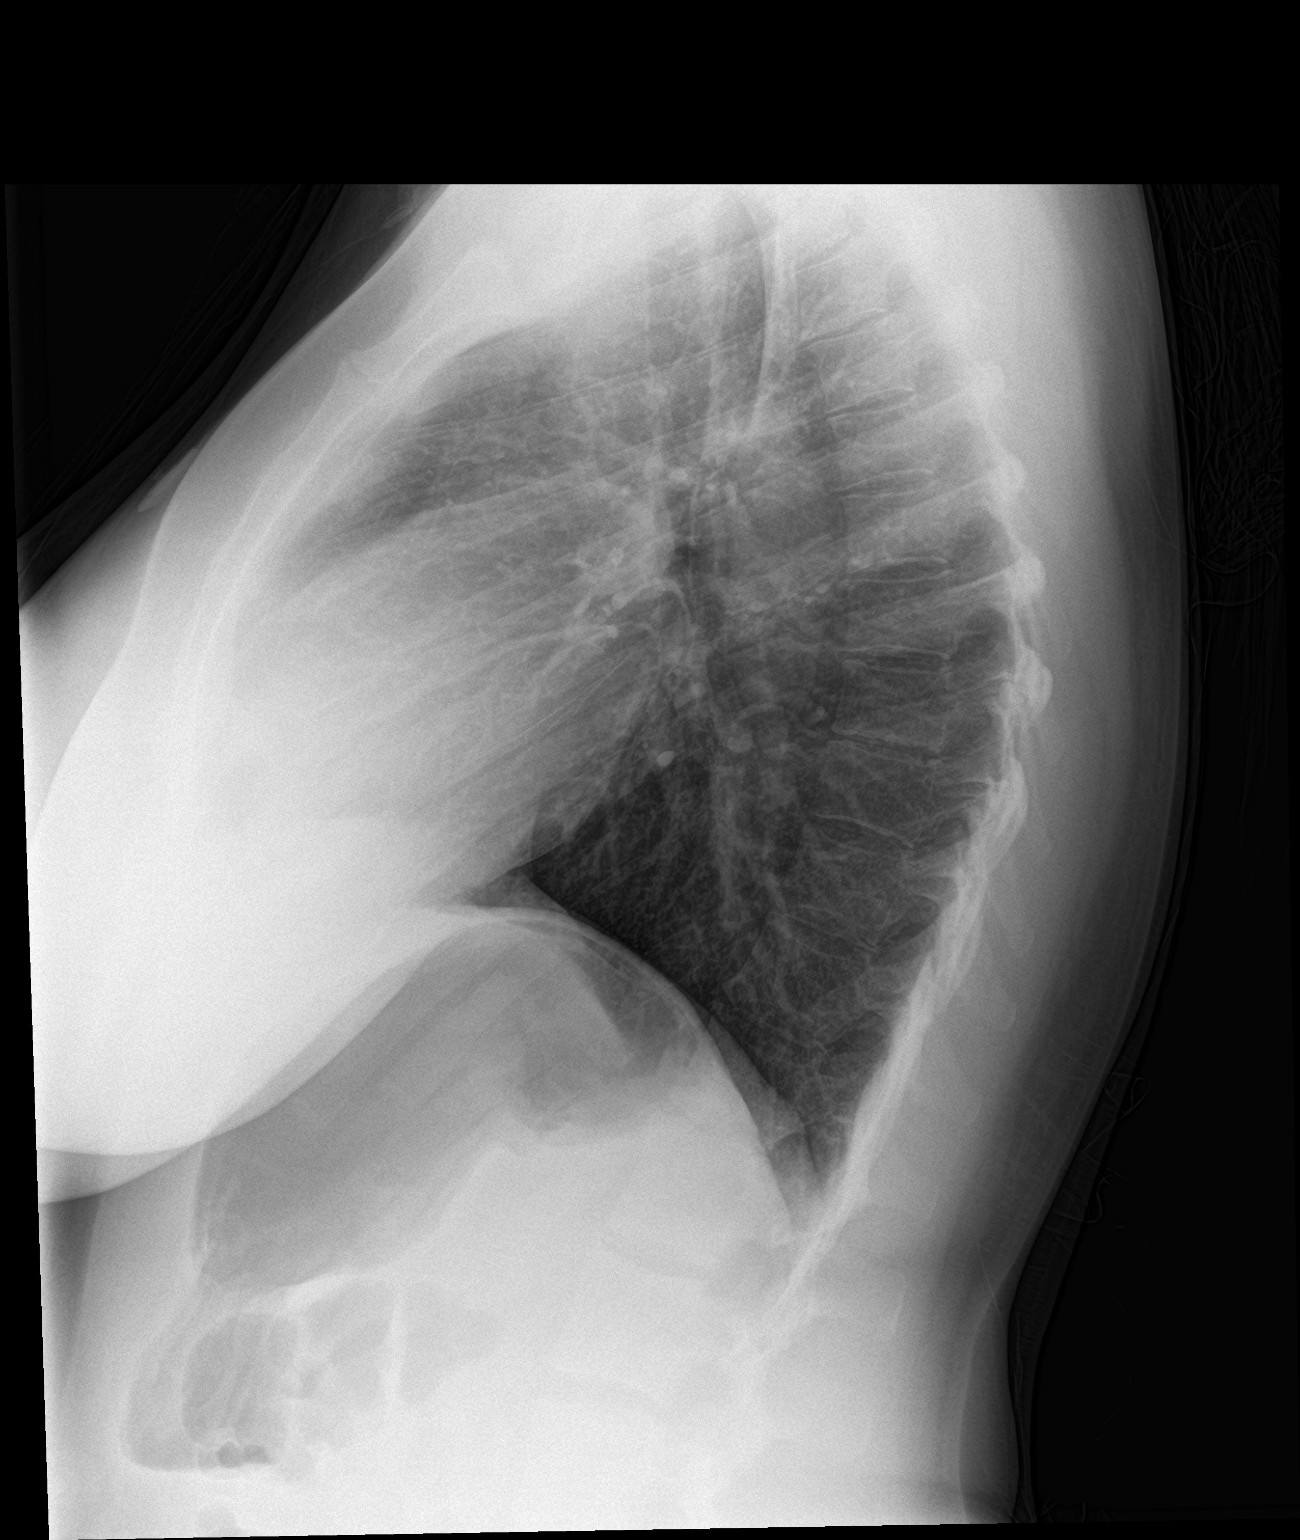

[2 of 2 positions shown; findings below may reference images not displayed]

FINDINGS: Normal lung volumes and mediastinal contours. Visualized tracheal
air column is within normal limits. No pneumothorax, pulmonary
edema, pleural effusion or confluent pulmonary opacity. EKG button
artifact in the right upper lung. No acute osseous abnormality
identified. Mild thoracolumbar scoliosis. Negative visible bowel gas
pattern.
IMPRESSION: Negative.  No acute cardiopulmonary abnormality.
